# Patient Record
Sex: Male | Born: 1985 | Race: Black or African American | Hispanic: No | Marital: Single | Smoking: Current some day smoker
Health system: Southern US, Community
[De-identification: ages and names within clinical notes are randomized; demographics above are authoritative.]

---

## 2019-12-04 ENCOUNTER — Emergency Department (HOSPITAL_COMMUNITY)
Admission: EM | Admit: 2019-12-04 | Discharge: 2019-12-04 | Disposition: A | Discharge status: Home / Self Care | Payer: Self-pay | Attending: Emergency Medicine | Admitting: Emergency Medicine

## 2019-12-04 ENCOUNTER — Emergency Department (HOSPITAL_BASED_OUTPATIENT_CLINIC_OR_DEPARTMENT_OTHER): Payer: Self-pay

## 2019-12-04 ENCOUNTER — Emergency Department (HOSPITAL_COMMUNITY): Payer: Self-pay

## 2019-12-04 ENCOUNTER — Encounter (HOSPITAL_COMMUNITY): Payer: Self-pay | Admitting: Psychiatry

## 2019-12-04 ENCOUNTER — Other Ambulatory Visit: Payer: Self-pay

## 2019-12-04 DIAGNOSIS — M7989 Other specified soft tissue disorders: Secondary | ICD-10-CM

## 2019-12-04 DIAGNOSIS — S82892H Other fracture of left lower leg, subsequent encounter for open fracture type I or II with delayed healing: Secondary | ICD-10-CM

## 2019-12-04 DIAGNOSIS — Y9351 Activity, roller skating (inline) and skateboarding: Secondary | ICD-10-CM | POA: Insufficient documentation

## 2019-12-04 DIAGNOSIS — M25472 Effusion, left ankle: Secondary | ICD-10-CM | POA: Insufficient documentation

## 2019-12-04 DIAGNOSIS — M79609 Pain in unspecified limb: Secondary | ICD-10-CM

## 2019-12-04 DIAGNOSIS — S82842H Displaced bimalleolar fracture of left lower leg, subsequent encounter for open fracture type I or II with delayed healing: Secondary | ICD-10-CM | POA: Insufficient documentation

## 2019-12-04 DIAGNOSIS — W010XXD Fall on same level from slipping, tripping and stumbling without subsequent striking against object, subsequent encounter: Secondary | ICD-10-CM | POA: Insufficient documentation

## 2019-12-04 MED ORDER — OXYCODONE-ACETAMINOPHEN 5-325 MG PO TABS
1.0000 | ORAL_TABLET | Freq: Four times a day (QID) | ORAL | 0 refills | Status: AC | PRN
Start: 2019-12-04 — End: ?

## 2019-12-04 MED ORDER — OXYCODONE-ACETAMINOPHEN 5-325 MG PO TABS
1.0000 | ORAL_TABLET | Freq: Once | ORAL | Status: AC
  Administered 2019-12-04: 1 via ORAL
  Filled 2019-12-04: qty 1

## 2019-12-04 NOTE — ED Notes (Signed)
Patient transported to Ultrasound 

## 2019-12-04 NOTE — ED Triage Notes (Signed)
Patient arrived from home with fiancee due to left ankle pain, patient had ankle surgery on Sept 10th and Sept 30th, had screws placed. Patient states the swelling in leg has decreased alot, but the swelling in ankle has stayed the same, and is painful. VSS NAD noted

## 2019-12-04 NOTE — Discharge Instructions (Signed)
You can use boot as needed to help protect the ankle when you are up and walking around, continue to use crutches, you should be nonweightbearing.  You need to elevate the ankle, with above your heart as much as possible to help reduce swelling, you can also apply ice.  Use prescribed pain medication as needed, this can cause drowsiness, do not take before driving.  Please call tomorrow to schedule close follow-up with your orthopedic surgeon.  I do not see signs of infection today but if you develop fevers, you note drainage, redness or warmth of the foot or ankle or any other new or concerning symptoms you should return for reevaluation.

## 2019-12-04 NOTE — Progress Notes (Signed)
Left lower extremity venous duplex completed. Refer to "CV Proc" under chart review to view preliminary results.  12/04/2019 2:52 PM Eula Fried., MHA, RVT, RDCS, RDMS

## 2019-12-04 NOTE — ED Provider Notes (Signed)
[facility] Provider Note   CSN: [id number] Arrival date & time: 12/04/19  1245     History Chief Complaint  Patient presents with  . Ankle Pain    left ankle pain and swelling     Clerance LavKyaris Seydel is a 34 y.o. male.  Miquel DunnKyaris Arville Carearks is a 34 y.o. male with a history of recent left ankle injury, who presents with continued pain and swelling to the left ankle. He states that on September 10th he was rollerblading with his kids when he fell and rolled his ankle. When he took off the roller skate he had a large laceration over the medial malleolus. He was living in [place] at the time and had surgery initially on September 10th where they placed plates and screws over the lateral malleolus and external fixation, external fixation was removed on September 30th, they did not place plates and screws over the medial malleolus fracture because of large open laceration. He followed up with his orthopedic surgeon at [facility] 2 weeks ago, at that time he was told that it would need to continue to heal slowly but swelling and pain could continue.  Patient states he has been told to remain nonweightbearing and they had talked about giving him a boot but due to what he thinks was an insurance issue at his last visit he was not provided a boot to wear, thinks this would help him to protect his ankle.  He is scheduled to follow-up with his surgeon next week, but in the meantime [place].  Has been staying with his mother-in-law and has had to be up on his feet with his kids for most of the day, has not been elevating the ankle much at all.  He states that swelling has worsened and pain while not worsening is not improving much.  He does not have any additional pain medication.  He has not noted any new redness warmth or drainage over incision or laceration sites.  No fevers or chills.  No general malaise, nausea or  vomiting.  No other aggravating or alleviating factors.        History reviewed. No pertinent past medical history.  There are no problems to display for this patient.   History reviewed. No pertinent surgical history.     No family history on file.  Social History   Tobacco Use  . Smoking status: Not on file  Substance Use Topics  . Alcohol use: Not on file  . Drug use: Not on file    Home Medications Prior to Admission medications   Medication Sig Start Date End Date Taking? Authorizing Provider  oxyCODONE-acetaminophen (PERCOCET) 5-325 MG tablet Take 1 tablet by mouth every 6 (six) hours as needed. 12/04/19   Dartha LodgeFord, Marlene Beidler N, PA-C    Allergies    Patient has no allergy information on record.  Review of Systems   Review of Systems  Constitutional: Negative for chills and fever.  Cardiovascular: Positive for leg swelling.  Musculoskeletal: Positive for arthralgias and joint swelling.  Skin: Positive for wound. Negative for color change.  Neurological: Negative for weakness and numbness.    Physical Exam Updated Vital Signs BP 133/83 (BP Location: Left Arm)   Pulse (!) 110   Temp 98.4 F (36.9 C) (Oral)   Resp 18   Ht 5\' 10"  (1.778 m)   Wt 108.9 kg   BMI 34.44 kg/m   Physical Exam Vitals  and nursing note reviewed.  Constitutional:      General: He is not in acute distress.    Appearance: Normal appearance. He is well-developed. He is obese. He is not ill-appearing or diaphoretic.     Comments: Well-appearing and in no distress  HENT:     Head: Normocephalic and atraumatic.  Eyes:     General:        Right eye: No discharge.        Left eye: No discharge.  Pulmonary:     Effort: Pulmonary effort is normal. No respiratory distress.  Musculoskeletal:     Comments: Significant edema to the left lower extremity starting at the foot and ankle and extending up to the mid shin.  Posterior surgical scar noted as well as well healing wound to the medial  malleolus with some scabbing, no erythema, warmth or drainage.  DP and PT pulses confirmed with Doppler, difficult to palpate due to edema.  Patient with normal sensation.  Limited range of motion.  Pain is worst over the medial malleolus.  Normal range of motion at the knee and hip.  Skin:    General: Skin is warm and dry.     Capillary Refill: Capillary refill takes less than 2 seconds.  Neurological:     Mental Status: He is alert and oriented to person, place, and time.     Coordination: Coordination normal.  Psychiatric:        Mood and Affect: Mood normal.        Behavior: Behavior normal.     ED Results / Procedures / Treatments   Labs (all labs ordered are listed, but only abnormal results are displayed) Labs Reviewed - No data to display  EKG None  Radiology DG Ankle 2 Views Left  Result Date: 12/04/2019 CLINICAL DATA:  Left ankle swelling after surgery two weeks ago. EXAM: LEFT ANKLE - 2 VIEW COMPARISON:  None. FINDINGS: Status post surgical internal fixation of distal left fibular fracture. Moderately displaced medial malleolar fracture is noted. Soft tissue swelling is noted medially and laterally. IMPRESSION: Status post surgical internal fixation of distal left fibular fracture. Moderately displaced medial malleolar fracture is noted. Electronically Signed   By: Lupita Raider M.D.   On: 12/04/2019 13:32   VAS [place] LOWER EXTREMITY VENOUS (DVT) (MC and WL 7a-7p)  Result Date: 12/04/2019  Lower Venous DVT Study Indications: Pain, Swelling, and S/p let.  Comparison Study: No prior study Performing Technologist: Gertie Fey MHA, RDMS, RVT, RDCS  Examination Guidelines: A complete evaluation includes B-mode imaging, spectral Doppler, color Doppler, and power Doppler as needed of all accessible portions of each vessel. Bilateral testing is considered an integral part of a complete examination. Limited examinations for reoccurring indications may be performed as noted. The  reflux portion of the exam is performed with the patient in reverse Trendelenburg.  +---------+---------------+---------+-----------+----------+--------------+ LEFT     CompressibilityPhasicitySpontaneityPropertiesThrombus Aging +---------+---------------+---------+-----------+----------+--------------+ CFV      Full           Yes      Yes                                 +---------+---------------+---------+-----------+----------+--------------+ SFJ      Full                                                        +---------+---------------+---------+-----------+----------+--------------+  FV Prox  Full                                                        +---------+---------------+---------+-----------+----------+--------------+ FV Mid   Full                                                        +---------+---------------+---------+-----------+----------+--------------+ FV DistalFull                                                        +---------+---------------+---------+-----------+----------+--------------+ PFV      Full                                                        +---------+---------------+---------+-----------+----------+--------------+ POP      Full           Yes      Yes                                 +---------+---------------+---------+-----------+----------+--------------+ PTV      Full                                                        +---------+---------------+---------+-----------+----------+--------------+ PERO     Full                                                        +---------+---------------+---------+-----------+----------+--------------+     Summary: LEFT: - There is no evidence of deep vein thrombosis in the lower extremity.  - No cystic structure found in the popliteal fossa. - Ultrasound characteristics of enlarged lymph nodes noted in the groin.  *See table(s) above for measurements and  observations. Electronically signed by Sherald Hess MD on 12/04/2019 at 4:23:21 PM.    Final     Procedures Procedures (including critical care time)  Medications Ordered in ED Medications  oxyCODONE-acetaminophen (PERCOCET/ROXICET) 5-325 MG per tablet 1 tablet (1 tablet Oral Given 12/04/19 1431)    ED Course  I have reviewed the triage vital signs and the nursing notes.  Pertinent labs & imaging results that were available during my care of the patient were reviewed by me and considered in my medical decision making (see chart for details).    MDM Rules/Calculators/A&P                         34 year old male presents with continued  pain and swelling after significant ankle injury which occurred on September 10, he had an open bimalleolar fracture and underwent surgery on the 10th, and then had external fixation removed on the 30th.  His surgery was done in [facility], and he followed up with his surgeon 2 weeks ago, at that time they were pleased with healing, he has been instructed to remain nonweightbearing, did not have any sort of boot or splint on at that time but thinks he was supposed to receive a boot for protection.  Has been up with his children daily and has not been elevating the foot much at all except at night when he sleeps.  Has noted worsening swelling and that pain is not improving, no new or worsening pain, no redness, warmth or drainage, no fevers or chills.  States that the medial malleolus fracture was not repaired with internal hardware due to open fracture and concern for infection.  X-rays obtained today show status post surgical fixation of the distal left fibular fracture, moderately displaced medial malleolar fracture is noted.  Given patient's exam and symptoms I am not concerned for infection, suspect the majority of swelling is due to ongoing healing and the fact that patient has not been elevating the foot much this is also probably contributing  to his continued pain.  But we will also get ultrasound to rule out DVT.  DVT study is negative.  Patient's pain treated and improving here in the emergency department.  Stressed the importance of elevation as much as possible as well as ice, patient will need to call to schedule close follow-up with his surgeon for reevaluation.  Given short course of pain medication here in the ED until he is able to follow-up.  Strict return precautions and signs of infection discussed.  Patient discharged home in good condition.  Final Clinical Impression(s) / ED Diagnoses Final diagnoses:  Swelling of ankle joint, left  Type I or II open fracture of left ankle with delayed healing, subsequent encounter    Rx / DC Orders ED Discharge Orders         Ordered    oxyCODONE-acetaminophen (PERCOCET) 5-325 MG tablet  Every 6 hours PRN        12/04/19 1608           Dartha Lodge, [place] 12/05/19 1228    Tegeler, Canary Brim, MD 12/08/19 [phone]

## 2019-12-21 ENCOUNTER — Emergency Department (HOSPITAL_COMMUNITY)
Admission: EM | Admit: 2019-12-21 | Discharge: 2019-12-21 | Discharge status: Against Medical Advice | Payer: Self-pay | Attending: Emergency Medicine | Admitting: Emergency Medicine

## 2019-12-21 ENCOUNTER — Encounter (HOSPITAL_COMMUNITY): Payer: Self-pay

## 2019-12-21 ENCOUNTER — Other Ambulatory Visit: Payer: Self-pay

## 2019-12-21 DIAGNOSIS — M25472 Effusion, left ankle: Secondary | ICD-10-CM

## 2019-12-21 DIAGNOSIS — F17200 Nicotine dependence, unspecified, uncomplicated: Secondary | ICD-10-CM | POA: Insufficient documentation

## 2019-12-21 DIAGNOSIS — R000 Tachycardia, unspecified: Secondary | ICD-10-CM | POA: Insufficient documentation

## 2019-12-21 DIAGNOSIS — R2242 Localized swelling, mass and lump, left lower limb: Secondary | ICD-10-CM | POA: Insufficient documentation

## 2019-12-21 NOTE — ED Triage Notes (Signed)
Pt presents with c/o left ankle pain. Pt has had recent injuries and surgeries to that ankle and is awaiting an MRI. Pt reports he needs something for the pain at this time until the MRI can happen.

## 2019-12-21 NOTE — ED Provider Notes (Signed)
[facility]-EMERGENCY DEPT Provider Note   CSN: [id number] Arrival date & time: 12/21/19  1558     History Chief Complaint  Patient presents with  . Ankle Pain    Brett Nelson is a 34 y.o. male.  HPI   Patient with no significant medical history presents to the emergency department with chief complaint of left ankle pain and swelling.  Patient had external fixation over his lateral and medial malleolus on September 10  in [place].  Patient has been followed by his surgeon since and is awaiting f MRI of his left leg due to unexplained pedal edema.  He was recently seen here in the emergency department on 11/11 for similar complaint x-ray and DVT study were performed does not reveal any acute findings.  Patient endorses that he was seen by his surgeon 2 weeks ago and had lab work and a DVT study done which did not show any acute findings.  After reviewing patient's notes cannot find records of this taking place.  Patient denies recent trauma to the area, denies IV drug use, is not immunocompromise.  Patient states his leg is always swollen and less swollen than usual, patient states he is here today because he has  worsening pain and is currently out of narcotic medication.  Patient states he was unable to get hold of his surgeon due to the holiday weekend.  Patient denies any alleviating factors.  Patient denies headaches, fevers, chills, shortness of breath, chest pain, abdominal pain, nausea, vomiting, diarrhea,  History reviewed. No pertinent past medical history.  There are no problems to display for this patient.   History reviewed. No pertinent surgical history.     History reviewed. No pertinent family history.  Social History   Tobacco Use  . Smoking status: Current Some Day Smoker  . Smokeless tobacco: Never Used  Substance Use Topics  . Alcohol use: Not on file  . Drug use: Not on file    Home Medications Prior to Admission  medications   Medication Sig Start Date End Date Taking? Authorizing Provider  oxyCODONE-acetaminophen (PERCOCET) 5-325 MG tablet Take 1 tablet by mouth every 6 (six) hours as needed. 12/04/19   Dartha Lodge, PA-C    Allergies    Patient has no known allergies.  Review of Systems   Review of Systems  Constitutional: Negative for chills and fever.  HENT: Negative for congestion.   Respiratory: Negative for shortness of breath.   Cardiovascular: Negative for chest pain.  Gastrointestinal: Negative for abdominal pain.  Genitourinary: Negative for enuresis.  Musculoskeletal: Positive for joint swelling. Negative for back pain.       Left leg pain leg swelling  Skin: Negative for rash.  Neurological: Negative for dizziness.  Hematological: Does not bruise/bleed easily.    Physical Exam Updated Vital Signs BP (!) 152/100 (BP Location: Left Arm)   Pulse (!) 101   Temp 98.9 F (37.2 C) (Oral)   Resp 16   SpO2 100%   Physical Exam Vitals and nursing note reviewed.  Constitutional:      General: He is not in acute distress.    Appearance: He is not ill-appearing.  HENT:     Head: Normocephalic and atraumatic.     Nose: No congestion.  Eyes:     Conjunctiva/sclera: Conjunctivae normal.  Cardiovascular:     Rate and Rhythm: Regular rhythm. Tachycardia present.     Pulses: Normal pulses.     Heart sounds: No murmur  heard.  No friction rub. No gallop.   Pulmonary:     Effort: No respiratory distress.     Breath sounds: No wheezing, rhonchi or rales.  Musculoskeletal:        General: Swelling and tenderness present.     Comments: Patient's left leg was visualized it was edematous, 2+ edema up until the shins, no erythema, warm to the touch, no drainage or discharge noted.  He had surgical scars noted on medial and lateral malleolus.  No signs of infection present.  Patient's medial lateral malleolus were tender to palpation, no crepitus or deformities palpated, neurovascular  fully intact.  Skin:    General: Skin is warm and dry.  Neurological:     Mental Status: He is alert.  Psychiatric:        Mood and Affect: Mood normal.         ED Results / Procedures / Treatments   Labs (all labs ordered are listed, but only abnormal results are displayed) Labs Reviewed  CBC WITH DIFFERENTIAL/PLATELET  BASIC METABOLIC PANEL    EKG None  Radiology No results found.  Procedures Procedures (including critical care time)  Medications Ordered in ED Medications - No data to display  ED Course  I have reviewed the triage vital signs and the nursing notes.  Pertinent labs & imaging results that were available during my care of the patient were reviewed by me and considered in my medical decision making (see chart for details).    MDM Rules/Calculators/A&P                          Patient presents with left leg pain and swelling.  He was alert, does not appear in acute distress, vital signs sent for tachycardia.  Will order imaging and obtain basic lab work for further evaluation.  Will provide patient with pain management.  Nurse notified me that patient is declining all lab work at this time and like to leave.  spoke with patient and he states he does not want to be here as he is afraid to drive in the dark and that he had all the stuff done prior.  He states he just wants something for pain.  I informed him that I need to work him up to ensure that he does not have a life or limb threatening abnormality and I can only do this by performing lab work and imaging.  I recommend that he stay and it is against my medical opinion for him to leave without being fully evaluated.  Patient states he understands the risks and would like to go home and will come back tomorrow if needed.  We discussed the nature and purpose, risks and benefits, as well as, the alternatives of treatment. Time was given to allow the opportunity to ask questions and consider their  options, and after the discussion, the patient decided to refuse the offerred treatment. The patient was informed that refusal could lead to, but was not limited to, death, permanent disability, or severe pain. If present, I asked the relatives or significant others to dissuade them without success. Prior to refusing, I determined that the patient had the capacity to make their decision and understood the consequences of that decision. After refusal, I made every reasonable opportunity to treat them to the best of my ability.  The patient was notified that they may return to the emergency department at any time for further treatment.  Final Clinical Impression(s) / ED Diagnoses Final diagnoses:  Left ankle swelling    Rx / DC Orders ED Discharge Orders    None       Carroll Sage, PA-C 12/21/19 1738    Terald Sleeper, MD 12/22/19 1249

## 2019-12-21 NOTE — ED Notes (Signed)
Patient refused blood work, vital signs and [place]. He states he just needs something for pain. PA notified.

## 2021-06-02 IMAGING — CR DG ANKLE 2V *L*
2 series · 2 of 2 positions shown · non-contrast
Comparison: None.

CLINICAL DATA: Left ankle swelling after surgery two weeks ago.

EXAM:
LEFT ANKLE - 2 VIEW

[ankle ap]
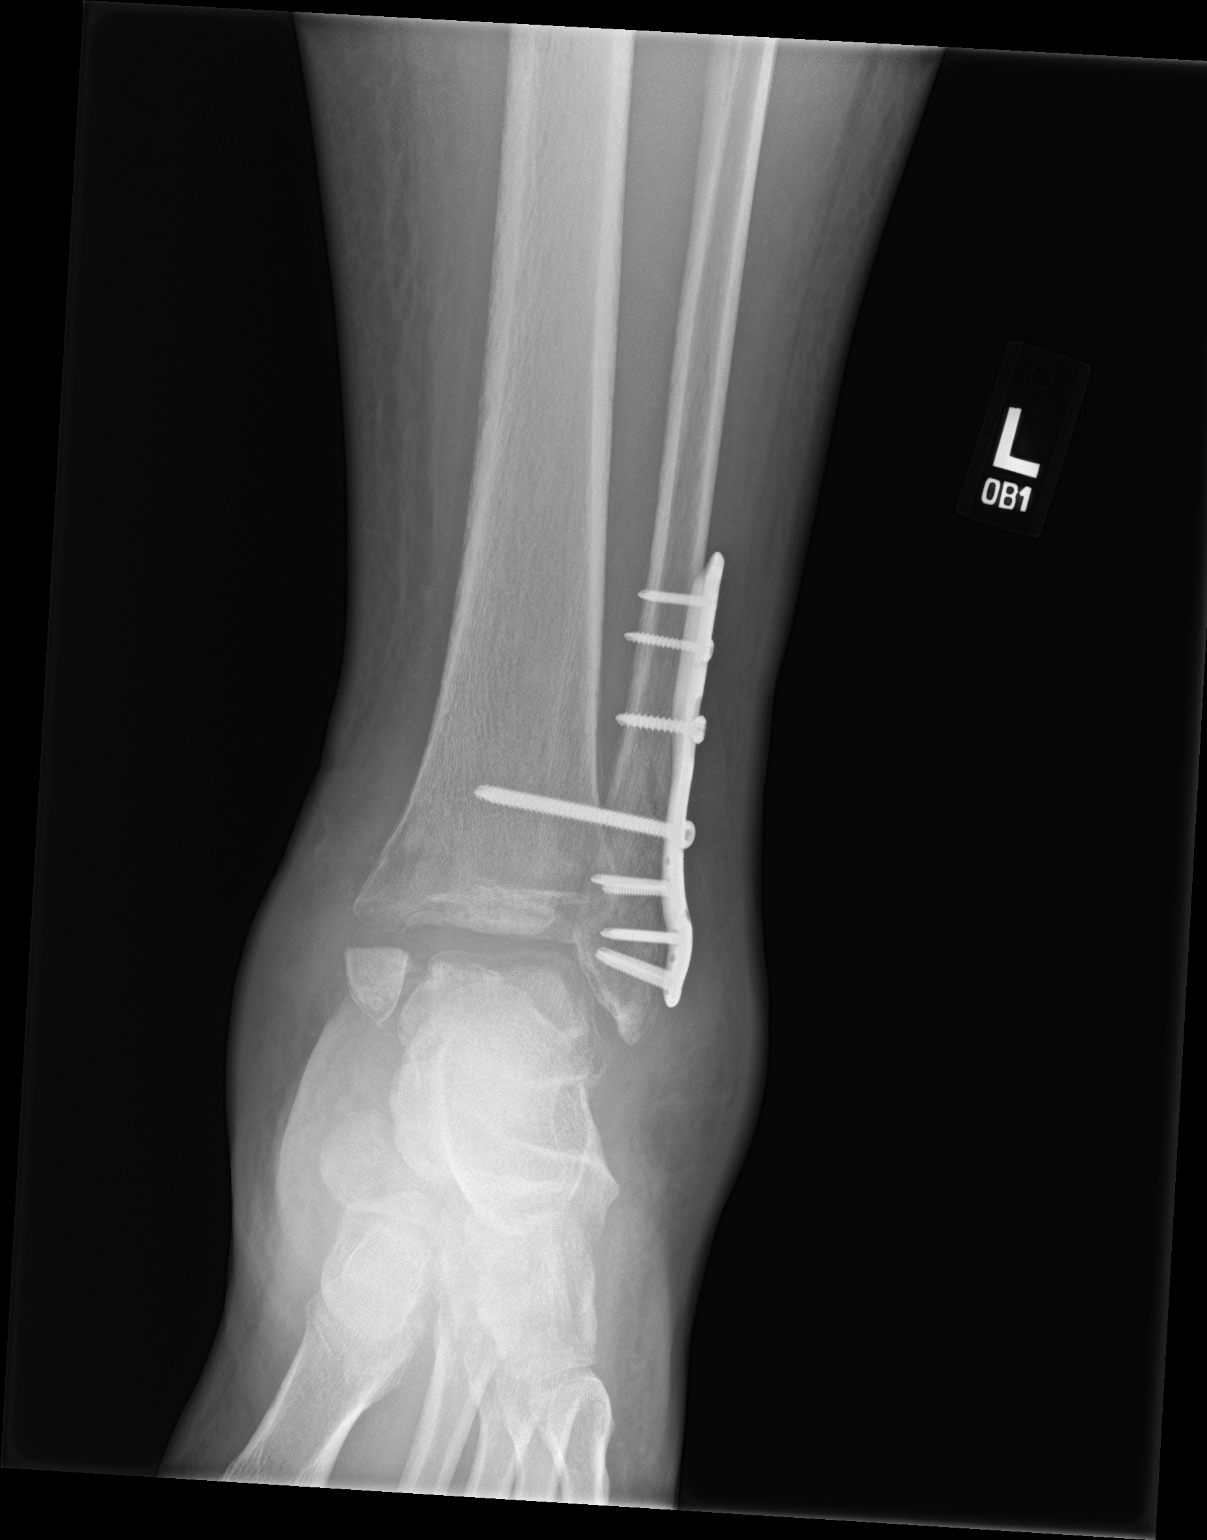

[ankle lat]
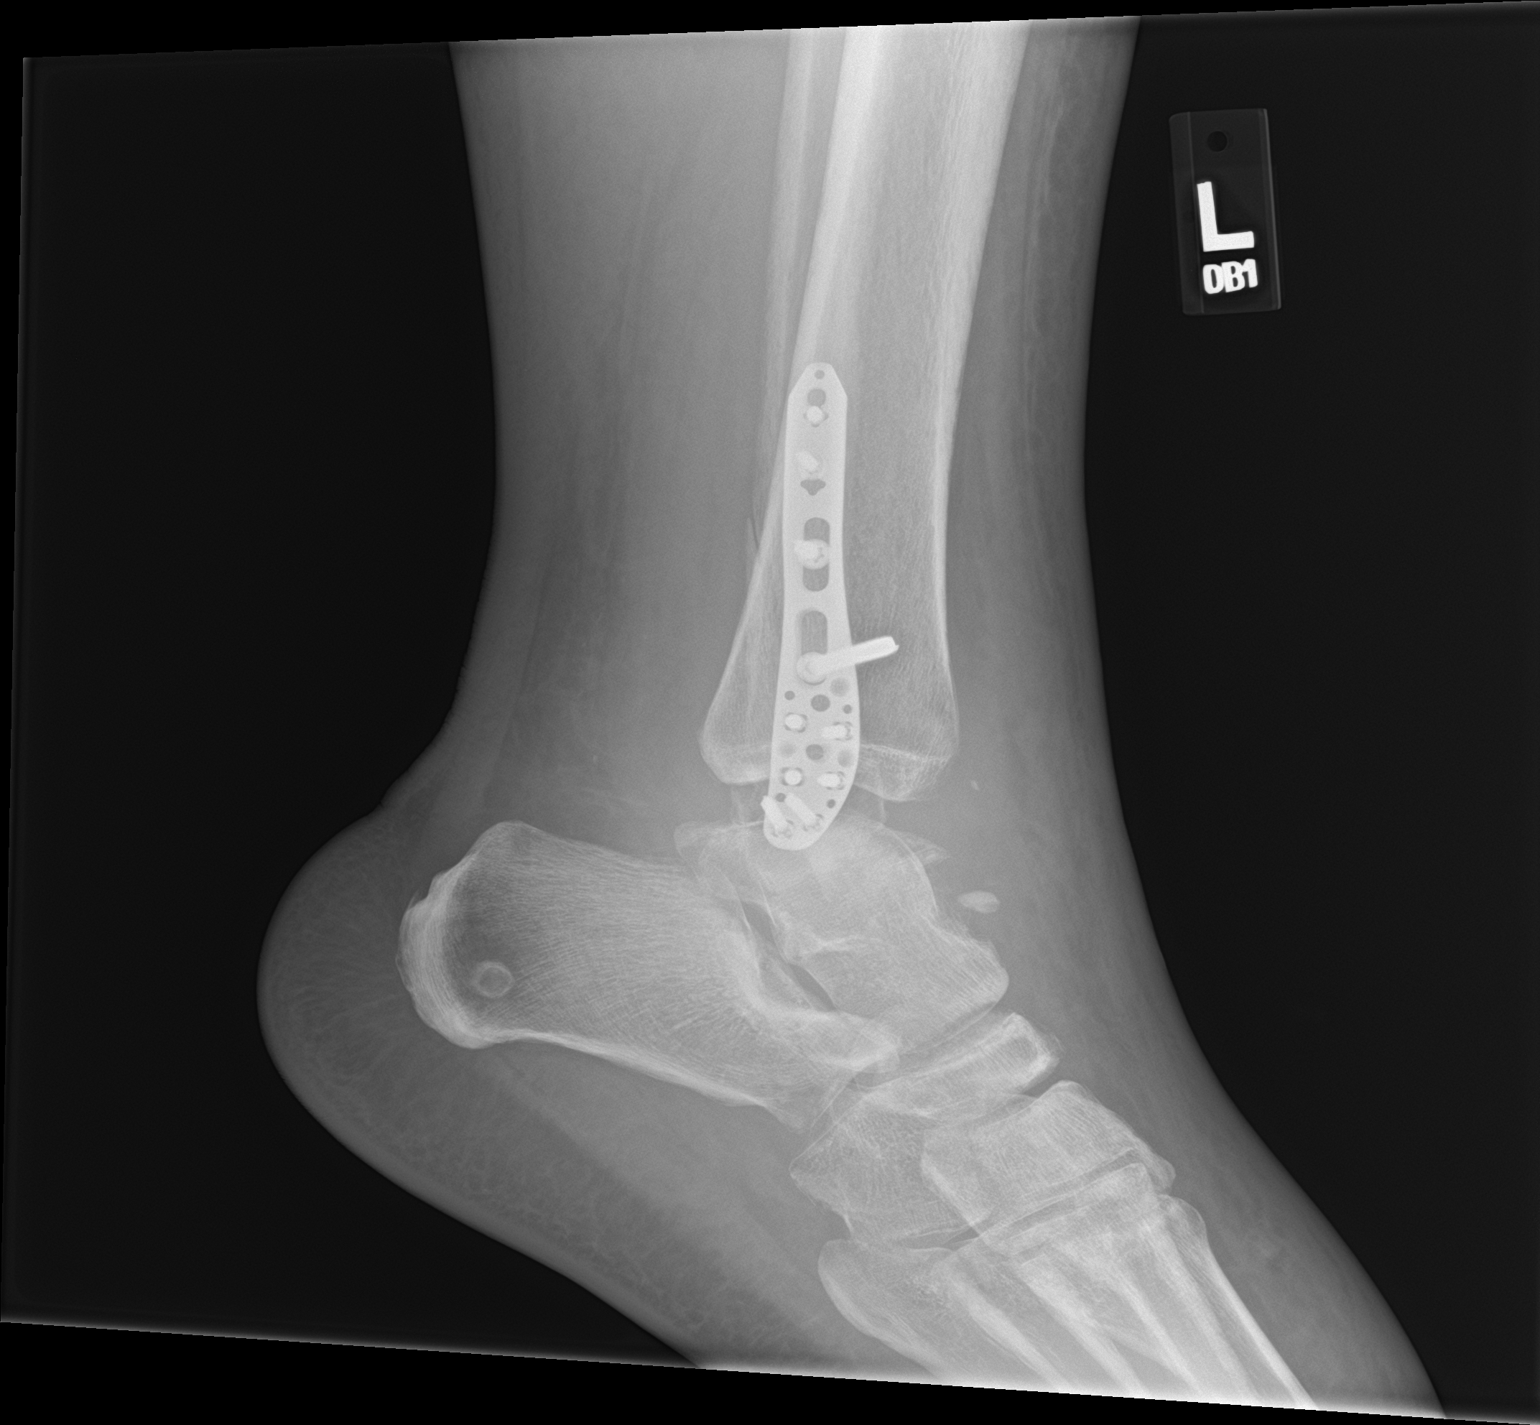

[2 of 2 positions shown; findings below may reference images not displayed]

FINDINGS: Status post surgical internal fixation of distal left fibular
fracture. Moderately displaced medial malleolar fracture is noted.
Soft tissue swelling is noted medially and laterally.
IMPRESSION: Status post surgical internal fixation of distal left fibular
fracture. Moderately displaced medial malleolar fracture is noted.
